# Patient Record
Sex: Female | Born: 1950 | Race: White | Hispanic: No | Marital: Married | Smoking: Former smoker
Health system: Southern US, Community
[De-identification: ages and names within clinical notes are randomized; demographics above are authoritative.]

## PROBLEM LIST (undated history)

## (undated) DIAGNOSIS — F32A Depression, unspecified: Secondary | ICD-10-CM

## (undated) DIAGNOSIS — G629 Polyneuropathy, unspecified: Secondary | ICD-10-CM

## (undated) DIAGNOSIS — Z972 Presence of dental prosthetic device (complete) (partial): Secondary | ICD-10-CM

## (undated) DIAGNOSIS — Z973 Presence of spectacles and contact lenses: Secondary | ICD-10-CM

## (undated) DIAGNOSIS — E538 Deficiency of other specified B group vitamins: Secondary | ICD-10-CM

## (undated) DIAGNOSIS — D649 Anemia, unspecified: Secondary | ICD-10-CM

## (undated) DIAGNOSIS — E039 Hypothyroidism, unspecified: Secondary | ICD-10-CM

## (undated) DIAGNOSIS — J302 Other seasonal allergic rhinitis: Secondary | ICD-10-CM

## (undated) DIAGNOSIS — F329 Major depressive disorder, single episode, unspecified: Secondary | ICD-10-CM

## (undated) HISTORY — PX: TONSILLECTOMY: SUR1361

## (undated) HISTORY — PX: TUBAL LIGATION: SHX77

---

## 2005-12-15 ENCOUNTER — Ambulatory Visit: Payer: Self-pay | Admitting: Family Medicine

## 2006-11-20 ENCOUNTER — Ambulatory Visit: Payer: Self-pay | Admitting: Emergency Medicine

## 2007-09-26 ENCOUNTER — Ambulatory Visit: Payer: Self-pay | Admitting: Family Medicine

## 2007-11-30 ENCOUNTER — Ambulatory Visit: Payer: Self-pay | Admitting: Unknown Physician Specialty

## 2008-10-04 ENCOUNTER — Ambulatory Visit: Payer: Self-pay | Admitting: Nurse Practitioner

## 2010-02-10 ENCOUNTER — Ambulatory Visit: Payer: Self-pay | Admitting: Family Medicine

## 2011-02-12 ENCOUNTER — Ambulatory Visit: Payer: Self-pay | Admitting: Family Medicine

## 2011-05-22 ENCOUNTER — Ambulatory Visit: Payer: Self-pay

## 2012-06-22 ENCOUNTER — Ambulatory Visit: Payer: Self-pay | Admitting: Ophthalmology

## 2012-06-22 HISTORY — PX: CATARACT EXTRACTION W/ INTRAOCULAR LENS IMPLANT: SHX1309

## 2015-04-01 ENCOUNTER — Encounter: Payer: Self-pay | Admitting: *Deleted

## 2015-04-04 NOTE — Discharge Instructions (Signed)

## 2015-04-08 ENCOUNTER — Ambulatory Visit: Admitting: Anesthesiology

## 2015-04-08 ENCOUNTER — Encounter
Admission: RE | Disposition: A | Discharge status: Home / Self Care | Payer: Self-pay | Source: Ambulatory Visit | Attending: Ophthalmology

## 2015-04-08 ENCOUNTER — Ambulatory Visit
Admission: RE | Admit: 2015-04-08 | Discharge: 2015-04-08 | Disposition: A | Discharge status: Home / Self Care | Source: Ambulatory Visit | Attending: Ophthalmology | Admitting: Ophthalmology

## 2015-04-08 DIAGNOSIS — D649 Anemia, unspecified: Secondary | ICD-10-CM | POA: Diagnosis not present

## 2015-04-08 DIAGNOSIS — Z9842 Cataract extraction status, left eye: Secondary | ICD-10-CM | POA: Diagnosis not present

## 2015-04-08 DIAGNOSIS — K5790 Diverticulosis of intestine, part unspecified, without perforation or abscess without bleeding: Secondary | ICD-10-CM | POA: Insufficient documentation

## 2015-04-08 DIAGNOSIS — Z87891 Personal history of nicotine dependence: Secondary | ICD-10-CM | POA: Diagnosis not present

## 2015-04-08 DIAGNOSIS — H2511 Age-related nuclear cataract, right eye: Secondary | ICD-10-CM | POA: Diagnosis present

## 2015-04-08 DIAGNOSIS — F329 Major depressive disorder, single episode, unspecified: Secondary | ICD-10-CM | POA: Insufficient documentation

## 2015-04-08 DIAGNOSIS — Z888 Allergy status to other drugs, medicaments and biological substances status: Secondary | ICD-10-CM | POA: Insufficient documentation

## 2015-04-08 DIAGNOSIS — E538 Deficiency of other specified B group vitamins: Secondary | ICD-10-CM | POA: Insufficient documentation

## 2015-04-08 DIAGNOSIS — E039 Hypothyroidism, unspecified: Secondary | ICD-10-CM | POA: Diagnosis not present

## 2015-04-08 DIAGNOSIS — G629 Polyneuropathy, unspecified: Secondary | ICD-10-CM | POA: Diagnosis not present

## 2015-04-08 HISTORY — DX: Presence of dental prosthetic device (complete) (partial): Z97.2

## 2015-04-08 HISTORY — DX: Anemia, unspecified: D64.9

## 2015-04-08 HISTORY — DX: Major depressive disorder, single episode, unspecified: F32.9

## 2015-04-08 HISTORY — DX: Deficiency of other specified B group vitamins: E53.8

## 2015-04-08 HISTORY — PX: CATARACT EXTRACTION W/PHACO: SHX586

## 2015-04-08 HISTORY — DX: Depression, unspecified: F32.A

## 2015-04-08 HISTORY — DX: Polyneuropathy, unspecified: G62.9

## 2015-04-08 HISTORY — DX: Other seasonal allergic rhinitis: J30.2

## 2015-04-08 HISTORY — DX: Presence of spectacles and contact lenses: Z97.3

## 2015-04-08 HISTORY — DX: Hypothyroidism, unspecified: E03.9

## 2015-04-08 SURGERY — PHACOEMULSIFICATION, CATARACT, WITH IOL INSERTION
Anesthesia: Monitor Anesthesia Care | Laterality: Right | Wound class: Clean

## 2015-04-08 MED ORDER — ARMC OPHTHALMIC DILATING GEL
1.0000 "application " | OPHTHALMIC | Status: DC | PRN
  Administered 2015-04-08 (×2): 1 via OPHTHALMIC

## 2015-04-08 MED ORDER — EPINEPHRINE HCL 1 MG/ML IJ SOLN
INTRAOCULAR | Status: DC | PRN
  Administered 2015-04-08: 117 mL via OPHTHALMIC

## 2015-04-08 MED ORDER — CEFUROXIME OPHTHALMIC INJECTION 1 MG/0.1 ML
INJECTION | OPHTHALMIC | Status: DC | PRN
  Administered 2015-04-08: 0.1 mL via INTRACAMERAL

## 2015-04-08 MED ORDER — LACTATED RINGERS IV SOLN: INTRAVENOUS | Status: DC

## 2015-04-08 MED ORDER — FENTANYL CITRATE (PF) 100 MCG/2ML IJ SOLN
INTRAMUSCULAR | Status: DC | PRN
  Administered 2015-04-08: 25 ug via INTRAVENOUS
  Administered 2015-04-08: 50 ug via INTRAVENOUS
  Administered 2015-04-08: 25 ug via INTRAVENOUS

## 2015-04-08 MED ORDER — TETRACAINE HCL 0.5 % OP SOLN
1.0000 [drp] | OPHTHALMIC | Status: DC | PRN
  Administered 2015-04-08: 1 [drp] via OPHTHALMIC

## 2015-04-08 MED ORDER — NA HYALUR & NA CHOND-NA HYALUR 0.4-0.35 ML IO KIT
PACK | INTRAOCULAR | Status: DC | PRN
  Administered 2015-04-08: 1 mL via INTRAOCULAR

## 2015-04-08 MED ORDER — POVIDONE-IODINE 5 % OP SOLN
1.0000 "application " | OPHTHALMIC | Status: DC | PRN
  Administered 2015-04-08: 1 via OPHTHALMIC

## 2015-04-08 MED ORDER — LIDOCAINE HCL (PF) 4 % IJ SOLN
INTRAOCULAR | Status: DC | PRN
  Administered 2015-04-08: 1 mL via OPHTHALMIC

## 2015-04-08 MED ORDER — TIMOLOL MALEATE 0.5 % OP SOLN
OPHTHALMIC | Status: DC | PRN
  Administered 2015-04-08: 1 [drp] via OPHTHALMIC

## 2015-04-08 MED ORDER — MIDAZOLAM HCL 2 MG/2ML IJ SOLN
INTRAMUSCULAR | Status: DC | PRN
Start: 2015-04-08 — End: 2015-04-08
  Administered 2015-04-08 (×2): 1 mg via INTRAVENOUS

## 2015-04-08 MED ORDER — BRIMONIDINE TARTRATE 0.2 % OP SOLN
OPHTHALMIC | Status: DC | PRN
  Administered 2015-04-08: 1 [drp] via OPHTHALMIC

## 2015-04-08 SURGICAL SUPPLY — 29 items
APPLICATOR COTTON TIP 3IN (MISCELLANEOUS) ×3 IMPLANT
CANNULA ANT/CHMB 27GA (MISCELLANEOUS) ×3 IMPLANT
DISSECTOR HYDRO NUCLEUS 50X22 (MISCELLANEOUS) ×3 IMPLANT
GLOVE BIO SURGEON STRL SZ7 (GLOVE) ×3 IMPLANT
GLOVE SURG LX 6.5 MICRO (GLOVE) ×2
GLOVE SURG LX STRL 6.5 MICRO (GLOVE) ×1 IMPLANT
GOWN STRL REUS W/ TWL LRG LVL3 (GOWN DISPOSABLE) ×2 IMPLANT
GOWN STRL REUS W/TWL LRG LVL3 (GOWN DISPOSABLE) ×4
LENS IOL ACRSF IQ PC 14.5 (Intraocular Lens) ×1 IMPLANT
LENS IOL ACRYSOF IQ POST 14.5 (Intraocular Lens) ×3 IMPLANT
MARKER SKIN SURG W/RULER VIO (MISCELLANEOUS) ×3 IMPLANT
NEEDLE FILTER BLUNT 18X 1/2SAF (NEEDLE) ×2
NEEDLE FILTER BLUNT 18X1 1/2 (NEEDLE) ×1 IMPLANT
PACK CATARACT BRASINGTON (MISCELLANEOUS) ×3 IMPLANT
PACK EYE AFTER SURG (MISCELLANEOUS) ×3 IMPLANT
PACK OPTHALMIC (MISCELLANEOUS) ×3 IMPLANT
RING MALYGIN 7.0 (MISCELLANEOUS) IMPLANT
SOL BAL SALT 15ML (MISCELLANEOUS)
SOLUTION BAL SALT 15ML (MISCELLANEOUS) IMPLANT
SUT ETHILON 10-0 CS-B-6CS-B-6 (SUTURE)
SUT VICRYL  9 0 (SUTURE)
SUT VICRYL 9 0 (SUTURE) IMPLANT
SUTURE EHLN 10-0 CS-B-6CS-B-6 (SUTURE) IMPLANT
SYR 3ML LL SCALE MARK (SYRINGE) ×3 IMPLANT
SYR TB 1ML LUER SLIP (SYRINGE) ×3 IMPLANT
WATER STERILE IRR 250ML POUR (IV SOLUTION) ×3 IMPLANT
WATER STERILE IRR 500ML POUR (IV SOLUTION) IMPLANT
WICK EYE OCUCEL (MISCELLANEOUS) IMPLANT
WIPE NON LINTING 3.25X3.25 (MISCELLANEOUS) ×3 IMPLANT

## 2015-04-08 NOTE — Anesthesia Postprocedure Evaluation (Signed)
Anesthesia Post Note  Patient: Warehouse managerBillie R Saddler  Procedure(s) Performed: Procedure(s) (LRB): CATARACT EXTRACTION PHACO AND INTRAOCULAR LENS PLACEMENT (IOC) (Right)  Patient location during evaluation: PACU Anesthesia Type: MAC Level of consciousness: awake and alert and oriented Pain management: satisfactory to patient Vital Signs Assessment: post-procedure vital signs reviewed and stable Respiratory status: spontaneous breathing, nonlabored ventilation and respiratory function stable Cardiovascular status: blood pressure returned to baseline and stable Postop Assessment: Adequate PO intake and No signs of nausea or vomiting Anesthetic complications: no    Last Vitals:  Filed Vitals:   04/08/15 1008 04/08/15 1133  BP: 132/76   Pulse: 60   Temp: 36.6 C 36.4 C  Resp: 16     Last Pain: There were no vitals filed for this visit.               Cherly BeachStella, Darian Ace J

## 2015-04-08 NOTE — H&P (Signed)
H+P reviewed and is up to date, please see paper chart.  

## 2015-04-08 NOTE — Anesthesia Preprocedure Evaluation (Signed)
Anesthesia Evaluation  Patient identified by MRN, date of birth, ID band  Reviewed: Allergy & Precautions, H&P , NPO status , Patient's Chart, lab work & pertinent test results  Airway Mallampati: II  TM Distance: >3 FB Neck ROM: full    Dental no notable dental hx.    Pulmonary former smoker,    Pulmonary exam normal        Cardiovascular  Rhythm:regular Rate:Normal     Neuro/Psych PSYCHIATRIC DISORDERS    GI/Hepatic   Endo/Other  Hypothyroidism   Renal/GU      Musculoskeletal   Abdominal   Peds  Hematology   Anesthesia Other Findings   Reproductive/Obstetrics                             Anesthesia Physical Anesthesia Plan  ASA: II  Anesthesia Plan: MAC   Post-op Pain Management:    Induction:   Airway Management Planned:   Additional Equipment:   Intra-op Plan:   Post-operative Plan:   Informed Consent: I have reviewed the patients History and Physical, chart, labs and discussed the procedure including the risks, benefits and alternatives for the proposed anesthesia with the patient or authorized representative who has indicated his/her understanding and acceptance.     Plan Discussed with: CRNA  Anesthesia Plan Comments:         Anesthesia Quick Evaluation

## 2015-04-08 NOTE — Transfer of Care (Signed)
Immediate Anesthesia Transfer of Care Note  Patient: Sophia Rivera  Procedure(s) Performed: Procedure(s): CATARACT EXTRACTION PHACO AND INTRAOCULAR LENS PLACEMENT (IOC) (Right)  Patient Location: PACU  Anesthesia Type: MAC  Level of Consciousness: awake, alert  and patient cooperative  Airway and Oxygen Therapy: Patient Spontanous Breathing and Patient connected to supplemental oxygen  Post-op Assessment: Post-op Vital signs reviewed, Patient's Cardiovascular Status Stable, Respiratory Function Stable, Patent Airway and No signs of Nausea or vomiting  Post-op Vital Signs: Reviewed and stable  Complications: No apparent anesthesia complications

## 2015-04-08 NOTE — Anesthesia Procedure Notes (Signed)
Procedure Name: MAC Performed by: Ahmani Prehn Pre-anesthesia Checklist: Patient identified, Emergency Drugs available, Suction available, Timeout performed and Patient being monitored Patient Re-evaluated:Patient Re-evaluated prior to inductionOxygen Delivery Method: Nasal cannula Placement Confirmation: positive ETCO2     

## 2015-04-08 NOTE — Op Note (Signed)
Date of Surgery: 04/08/2015  PREOPERATIVE DIAGNOSES: Visually significant nuclear sclerotic cataract, right eye.  POSTOPERATIVE DIAGNOSES: Same  PROCEDURES PERFORMED: Cataract extraction with intraocular lens implant, right eye.  SURGEON: Devin GoingAnita P. Vin, M.D.  ANESTHESIA: MAC and topical  IMPLANTS: AcrySof IQ [id number] +14.5 D   Implant Name Type Inv. Item Serial No. Manufacturer Lot No. LRB No. Used  IMPLANT LENS SN60WF 14.5 - [id number] Intraocular Lens IMPLANT LENS SN60WF 14.5 [id number] [employer]   Right 1     COMPLICATIONS: None.  DESCRIPTION OF PROCEDURE: Therapeutic options were discussed with the patient preoperatively, including a discussion of risks and benefits of surgery. Informed consent was obtained. An IOL-Master and immersion biometry were used to take the lens measurements, and a dilated fundus exam was performed within 6 months of the surgical date.  The patient was premedicated and brought to the operating room and placed on the operating table in the supine position. After adequate anesthesia, the patient was prepped and draped in the usual sterile ophthalmic fashion. A wire lid speculum was inserted and the microscope was positioned. A Superblade was used to create a paracentesis site at the limbus and a small amount of dilute preservative free lidocaine was instilled into the anterior chamber, followed by dispersive viscoelastic. A clear corneal incision was created temporally using a 2.4 mm keratome blade. Capsulorrhexis was then performed. In situ phacoemulsification was performed.  Cortical material was removed with the irrigation-aspiration unit. Dispersive viscoelastic was instilled to open the capsular bag. A posterior chamber intraocular lens with the specifications above was inserted and positioned. Irrigation-aspiration was used to remove all viscoelastic. Cefuroxime 1cc was instilled into the anterior chamber, and the corneal incision was checked and found to be  water tight. The eyelid speculum was removed.  The operative eye was covered with protective goggles after instilling 1 drop of timolol and brimonidine. The patient tolerated the procedure well. There were no complications.

## 2015-04-09 ENCOUNTER — Encounter: Payer: Self-pay | Admitting: Ophthalmology

## 2016-01-13 ENCOUNTER — Encounter: Payer: Self-pay | Admitting: *Deleted

## 2016-01-13 ENCOUNTER — Ambulatory Visit
Admission: EM | Admit: 2016-01-13 | Discharge: 2016-01-13 | Disposition: A | Discharge status: Home / Self Care | Attending: Family Medicine | Admitting: Family Medicine

## 2016-01-13 DIAGNOSIS — N390 Urinary tract infection, site not specified: Secondary | ICD-10-CM | POA: Diagnosis not present

## 2016-01-13 LAB — URINALYSIS COMPLETE WITH MICROSCOPIC (ARMC ONLY)
GLUCOSE, UA: NEGATIVE mg/dL
NITRITE: POSITIVE — AB
Protein, ur: 100 mg/dL — AB
SPECIFIC GRAVITY, URINE: 1.02 (ref 1.005–1.030)
Squamous Epithelial / LPF: NONE SEEN
pH: 7 (ref 5.0–8.0)

## 2016-01-13 MED ORDER — CIPROFLOXACIN HCL 500 MG PO TABS: 500.0000 mg | ORAL_TABLET | Freq: Two times a day (BID) | ORAL | 0 refills | Status: AC

## 2016-01-13 NOTE — ED Triage Notes (Signed)
Patient started having symptoms of burning and visible blood in urine yesterday PM. Patient states having a UTI 5 years ago.

## 2016-01-13 NOTE — ED Provider Notes (Signed)
[facility]    CSN: [id number] Arrival date & time: 01/13/16  0905  First Provider Contact:  None       History   Chief Complaint Chief Complaint  Patient presents with  . Urinary Frequency    HPI Sophia Rivera is a 65 y.o. female.   The history is provided by the patient.  Dysuria  Pain quality:  Burning Pain severity:  Moderate Onset quality:  Sudden Duration:  2 days Timing:  Constant Progression:  Worsening Chronicity:  New Recent urinary tract infections: no   Relieved by:  None tried Ineffective treatments:  None tried Urinary symptoms: frequent urination and hesitancy   Urinary symptoms: no discolored urine, no foul-smelling urine, no hematuria and no bladder incontinence   Associated symptoms: no abdominal pain, no fever, no flank pain, no genital lesions, no nausea, no vaginal discharge and no vomiting   Risk factors: no hx of pyelonephritis, no hx of urolithiasis, no kidney transplant, not pregnant, no recurrent urinary tract infections, no renal cysts, no renal disease, no sexually transmitted infections, no single kidney and no urinary catheter     Past Medical History:  Diagnosis Date  . Anemia   . Depression   . Hypothyroidism   . Neuropathy (HCC)    from vitamin b12 deficiency  . Seasonal allergies   . Vitamin B12 deficiency   . Wears contact lenses    right eye  . Wears dentures    partial upper    There are no active problems to display for this patient.   Past Surgical History:  Procedure Laterality Date  . CATARACT EXTRACTION W/ INTRAOCULAR LENS IMPLANT Left 06/22/2012   Dr. Inez Pilgrim, [facility]  . CATARACT EXTRACTION W/PHACO Right 04/08/2015   Procedure: CATARACT EXTRACTION PHACO AND INTRAOCULAR LENS PLACEMENT (IOC);  Surgeon: Sherald Hess, MD;  Location: [facility];  Service: Ophthalmology;  Laterality: Right;  . CESAREAN SECTION    . TONSILLECTOMY    . TUBAL LIGATION      OB History    No data  available       Home Medications    Prior to Admission medications   Medication Sig Start Date End Date Taking? Authorizing Provider  buPROPion (WELLBUTRIN XL) 150 MG 24 hr tablet Take 150 mg by mouth daily.   Yes Historical Provider, MD  Cyanocobalamin (VITAMIN B12 PO) Take by mouth.   Yes Historical Provider, MD  FIBER PO Take by mouth.   Yes Historical Provider, MD  levothyroxine (SYNTHROID, LEVOTHROID) 175 MCG tablet Take 175 mcg by mouth daily before breakfast.   Yes Historical Provider, MD  MAGNESIUM PO Take by mouth.   Yes Historical Provider, MD  naproxen sodium (ANAPROX) 220 MG tablet Take 220 mg by mouth as needed.   Yes Historical Provider, MD  venlafaxine (EFFEXOR) 75 MG tablet Take 75 mg by mouth 2 (two) times daily. 150 mg AM, 75 mg PM   Yes Historical Provider, MD  BIOTIN PO Take by mouth.    Historical Provider, MD  buPROPion (WELLBUTRIN) 75 MG tablet Take 75 mg by mouth daily. AM    Historical Provider, MD  ciprofloxacin (CIPRO) 500 MG tablet Take 1 tablet (500 mg total) by mouth every 12 (twelve) hours. 01/13/16   Payton Mccallum, MD  levothyroxine (SYNTHROID, LEVOTHROID) 150 MCG tablet Take 150 mcg by mouth daily before breakfast.    Historical Provider, MD    Family History History reviewed. No pertinent family history.  Social History Social  History  Substance Use Topics  . Smoking status: Former Smoker    Packs/day: 0.50    Years: 30.00  . Smokeless tobacco: Never Used     Comment: quit approx 1999  . Alcohol use No     Allergies   Prednisone   Review of Systems Review of Systems  Constitutional: Negative for fever.  Gastrointestinal: Negative for abdominal pain, nausea and vomiting.  Genitourinary: Positive for dysuria. Negative for flank pain and vaginal discharge.     Physical Exam Triage Vital Signs ED Triage Vitals  Enc Vitals Group     BP 01/13/16 0924 122/73     Pulse Rate 01/13/16 0924 81     Resp 01/13/16 0924 18     Temp 01/13/16  0924 99.1 F (37.3 C)     Temp Source 01/13/16 0924 Oral     SpO2 01/13/16 0924 98 %     Weight 01/13/16 0927 175 lb (79.4 kg)     Height 01/13/16 0927 5\' 7"  (1.702 m)     Head Circumference --      Peak Flow --      Pain Score 01/13/16 0933 2     Pain Loc --      Pain Edu? --      Excl. in GC? --    No data found.   Updated Vital Signs BP 122/73 (BP Location: Right Arm)   Pulse 81   Temp 99.1 F (37.3 C) (Oral)   Resp 18   Ht 5\' 7"  (1.702 m)   Wt 175 lb (79.4 kg)   SpO2 98%   BMI 27.41 kg/m   Visual Acuity Right Eye Distance:   Left Eye Distance:   Bilateral Distance:    Right Eye Near:   Left Eye Near:    Bilateral Near:     Physical Exam  Constitutional: She appears well-developed and well-nourished. No distress.  Abdominal: Soft. Bowel sounds are normal. She exhibits no distension and no mass. There is tenderness (mild suprapubic). There is no rebound and no guarding.  Skin: She is not diaphoretic.  Nursing note and vitals reviewed.    UC Treatments / Results  Labs (all labs ordered are listed, but only abnormal results are displayed) Labs Reviewed  URINALYSIS COMPLETEWITH MICROSCOPIC ([facility] ONLY) - Abnormal; Notable for the following:       Result Value   Color, Urine AMBER (*)    APPearance CLOUDY (*)    Bilirubin Urine SMALL (*)    Ketones, ur TRACE (*)    Hgb urine dipstick LARGE (*)    Protein, ur 100 (*)    Nitrite POSITIVE (*)    Leukocytes, UA MODERATE (*)    Bacteria, UA MANY (*)    All other components within normal limits    EKG  EKG Interpretation None       Radiology No results found.  Procedures Procedures (including critical care time)  Medications Ordered in UC Medications - No data to display   Initial Impression / Assessment and Plan / UC Course  I have reviewed the triage vital signs and the nursing notes.  Pertinent labs & imaging results that were available during my care of the patient were reviewed by me and  considered in my medical decision making (see chart for details).  Clinical Course      Final Clinical Impressions(s) / UC Diagnoses   Final diagnoses:  UTI (lower urinary tract infection)    New Prescriptions Discharge Medication List as  of 01/13/2016 10:20 AM    START taking these medications   Details  ciprofloxacin (CIPRO) 500 MG tablet Take 1 tablet (500 mg total) by mouth every 12 (twelve) hours., Starting Mon 01/13/2016, Normal       1. Lab results and diagnosis reviewed with patient 2. rx as per orders above; reviewed possible side effects, interactions, risks and benefits  3. Recommend supportive treatment with increased water intake 4. Follow-up prn if symptoms worsen or don't improve    Payton Mccallumrlando Leticia Coletta, MD 01/13/16 1102

## 2017-06-09 ENCOUNTER — Other Ambulatory Visit: Payer: Self-pay | Admitting: Family Medicine

## 2017-06-09 DIAGNOSIS — Z1231 Encounter for screening mammogram for malignant neoplasm of breast: Secondary | ICD-10-CM

## 2017-06-15 ENCOUNTER — Encounter (INDEPENDENT_AMBULATORY_CARE_PROVIDER_SITE_OTHER): Payer: Self-pay

## 2017-06-15 ENCOUNTER — Ambulatory Visit
Admission: RE | Admit: 2017-06-15 | Discharge: 2017-06-15 | Disposition: A | Discharge status: Home / Self Care | Payer: Medicare Other | Source: Ambulatory Visit | Attending: Family Medicine | Admitting: Family Medicine

## 2017-06-15 DIAGNOSIS — Z1231 Encounter for screening mammogram for malignant neoplasm of breast: Secondary | ICD-10-CM | POA: Insufficient documentation

## 2017-08-02 ENCOUNTER — Other Ambulatory Visit: Payer: Self-pay | Admitting: Family Medicine

## 2017-08-02 DIAGNOSIS — Z780 Asymptomatic menopausal state: Secondary | ICD-10-CM

## 2018-06-08 ENCOUNTER — Other Ambulatory Visit: Payer: Self-pay | Admitting: Family Medicine

## 2018-06-08 DIAGNOSIS — Z1231 Encounter for screening mammogram for malignant neoplasm of breast: Secondary | ICD-10-CM

## 2018-06-27 ENCOUNTER — Ambulatory Visit
Admission: RE | Admit: 2018-06-27 | Discharge: 2018-06-27 | Disposition: A | Discharge status: Home / Self Care | Payer: Medicare Other | Source: Ambulatory Visit | Attending: Family Medicine | Admitting: Family Medicine

## 2018-06-27 ENCOUNTER — Encounter (INDEPENDENT_AMBULATORY_CARE_PROVIDER_SITE_OTHER): Payer: Self-pay

## 2018-06-27 DIAGNOSIS — Z1231 Encounter for screening mammogram for malignant neoplasm of breast: Secondary | ICD-10-CM | POA: Diagnosis present

## 2018-06-27 DIAGNOSIS — Z780 Asymptomatic menopausal state: Secondary | ICD-10-CM | POA: Diagnosis present

## 2018-06-29 ENCOUNTER — Other Ambulatory Visit: Payer: Self-pay | Admitting: Family Medicine

## 2018-06-29 DIAGNOSIS — R928 Other abnormal and inconclusive findings on diagnostic imaging of breast: Secondary | ICD-10-CM

## 2018-07-05 ENCOUNTER — Ambulatory Visit
Admission: RE | Admit: 2018-07-05 | Discharge: 2018-07-05 | Disposition: A | Discharge status: Home / Self Care | Payer: Medicare Other | Source: Ambulatory Visit | Attending: Family Medicine | Admitting: Family Medicine

## 2018-07-05 DIAGNOSIS — R928 Other abnormal and inconclusive findings on diagnostic imaging of breast: Secondary | ICD-10-CM

## 2019-10-09 DIAGNOSIS — I10 Essential (primary) hypertension: Secondary | ICD-10-CM | POA: Insufficient documentation

## 2020-02-12 ENCOUNTER — Other Ambulatory Visit: Payer: Medicare Other

## 2020-02-12 DIAGNOSIS — Z20822 Contact with and (suspected) exposure to covid-19: Secondary | ICD-10-CM

## 2020-02-14 LAB — SARS-COV-2, NAA 2 DAY TAT

## 2020-02-14 LAB — NOVEL CORONAVIRUS, NAA: SARS-CoV-2, NAA: DETECTED — AB

## 2020-02-15 ENCOUNTER — Telehealth: Payer: Self-pay | Admitting: Nurse Practitioner

## 2020-02-15 ENCOUNTER — Encounter: Payer: Self-pay | Admitting: Nurse Practitioner

## 2020-02-15 DIAGNOSIS — U071 COVID-19: Secondary | ICD-10-CM

## 2020-02-15 NOTE — Telephone Encounter (Signed)
Called to Discuss with patient about Covid symptoms and the use of regeneron, a monoclonal antibody infusion for those with mild to moderate Covid symptoms and at a high risk of hospitalization.  Based on social vulnerability.    Pt is qualified for this infusion at the [facility] center due to co-morbid conditions and/or a member of an at-risk group.     Unable to reach pt. Left message to return call. Sent [employer]. Sent Text.   Consuello Masse, DNP, AGNP-C [phone] (Infusion Center Hotline)

## 2020-09-23 IMAGING — MG DIGITAL SCREENING BILATERAL MAMMOGRAM WITH TOMO AND CAD
8 series · 8 of 24 positions shown · non-contrast
Comparison: Previous exam(s).

CLINICAL DATA: Screening.

EXAM:
DIGITAL SCREENING BILATERAL MAMMOGRAM WITH TOMO AND CAD

[L CC synth-2D]
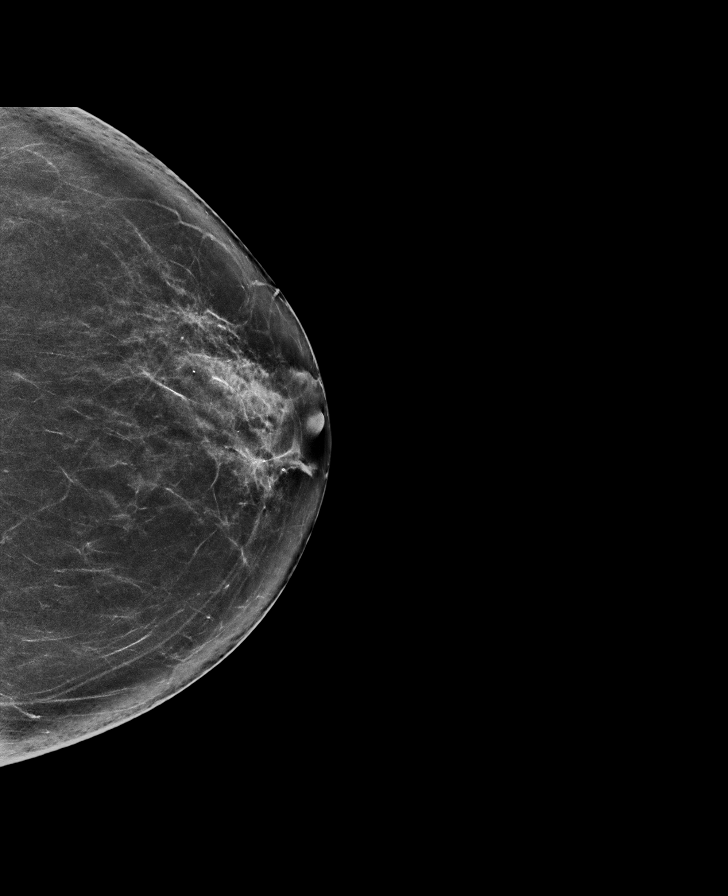

[R MLO synth-2D]
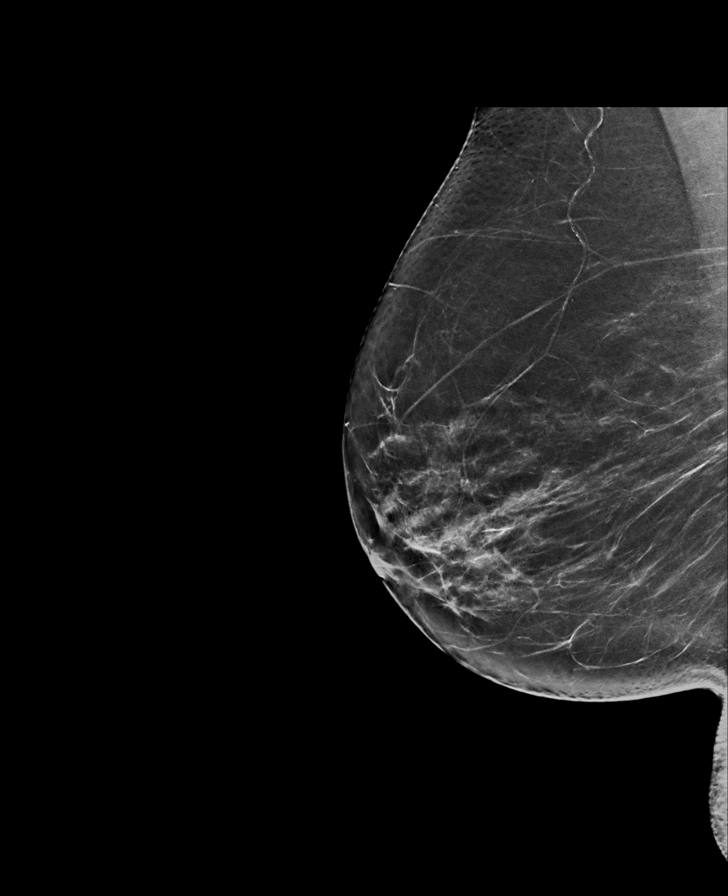

[R CC synth-2D]
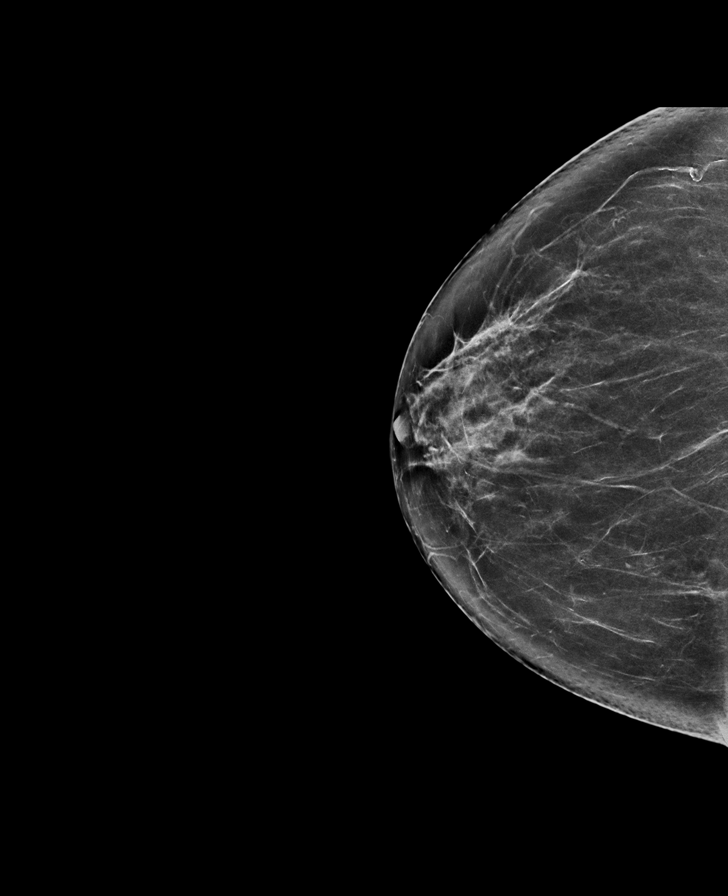

[L MLO synth-2D]
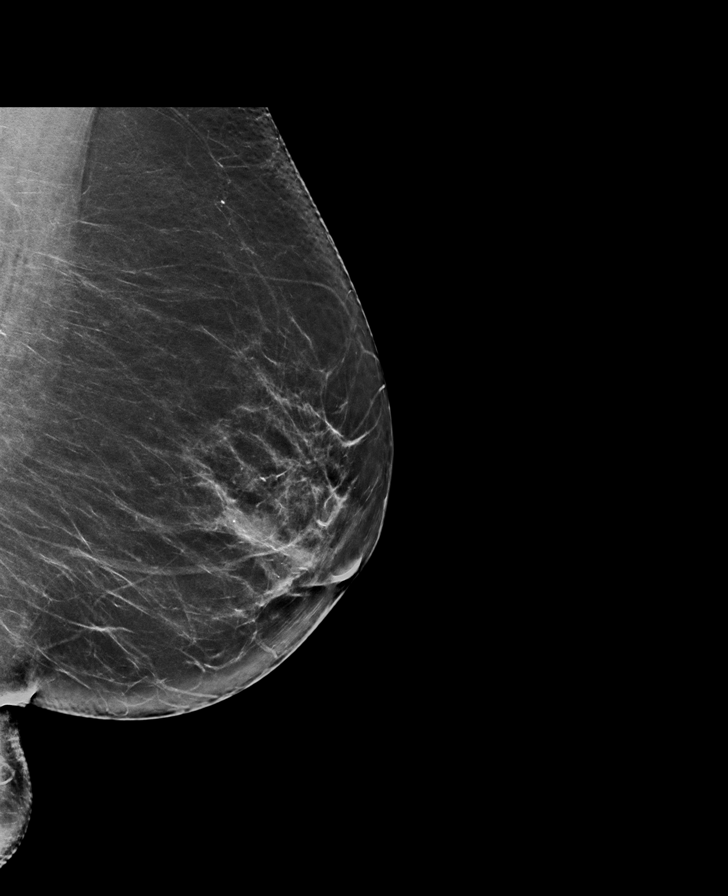

[L MLO tomo · tomo slice 39/78.0]
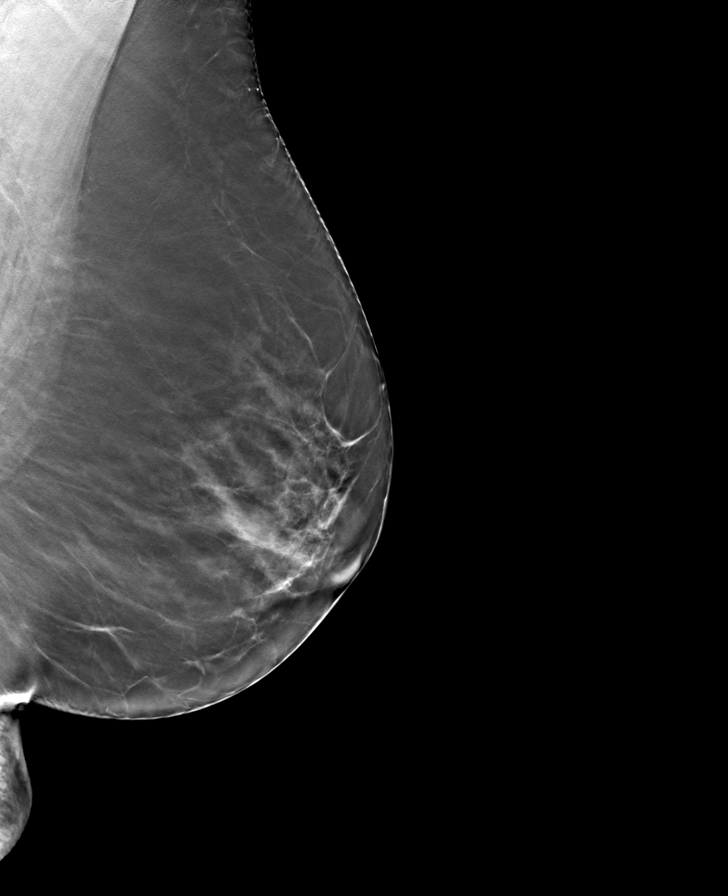

[R MLO tomo · tomo slice 37/73.0]
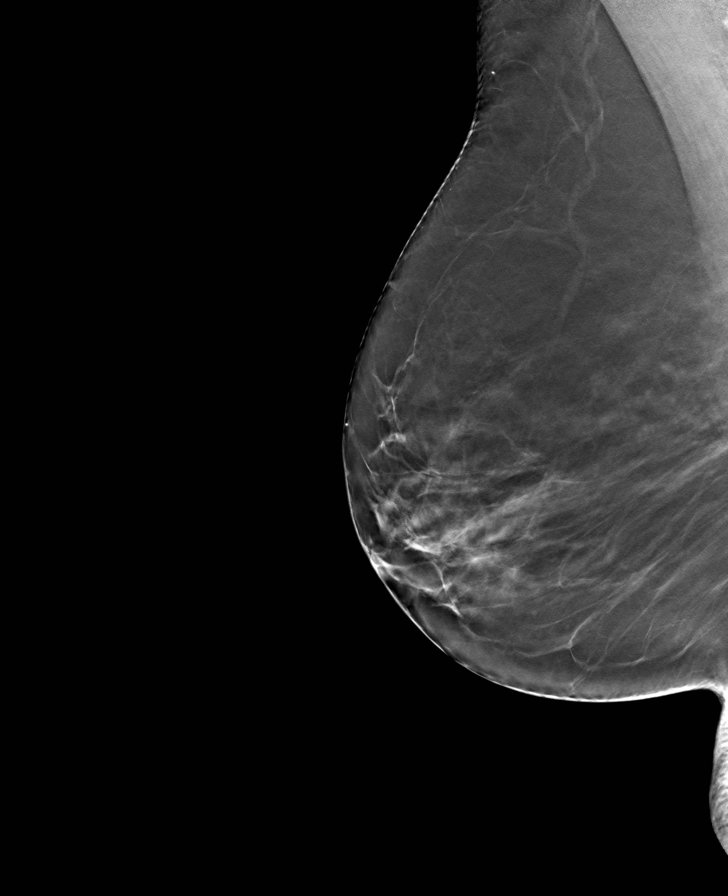

[R CC tomo · tomo slice 37/74.0]
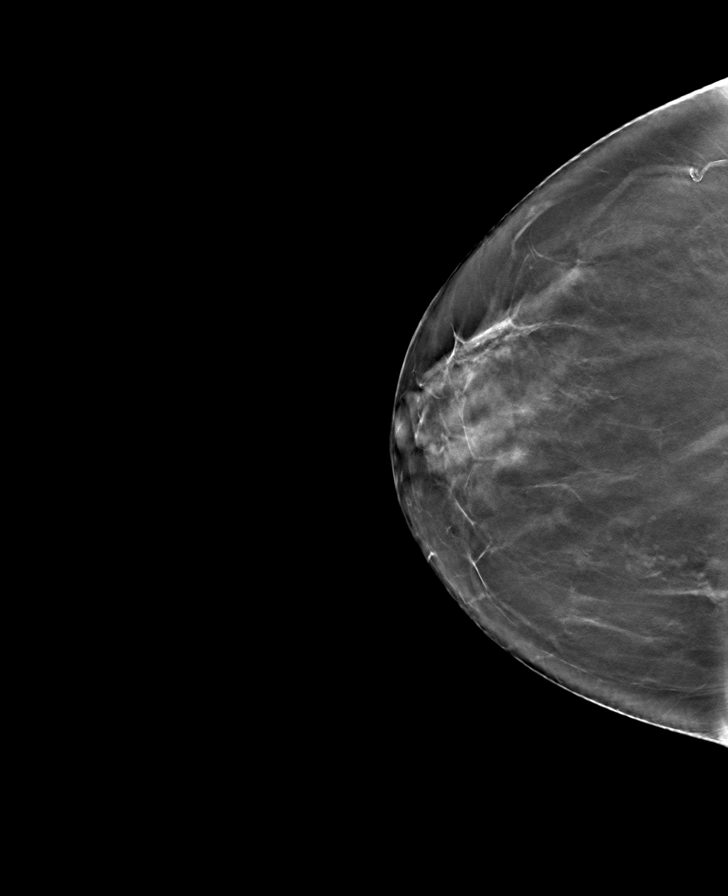

[L CC tomo · tomo slice 37/73.0]
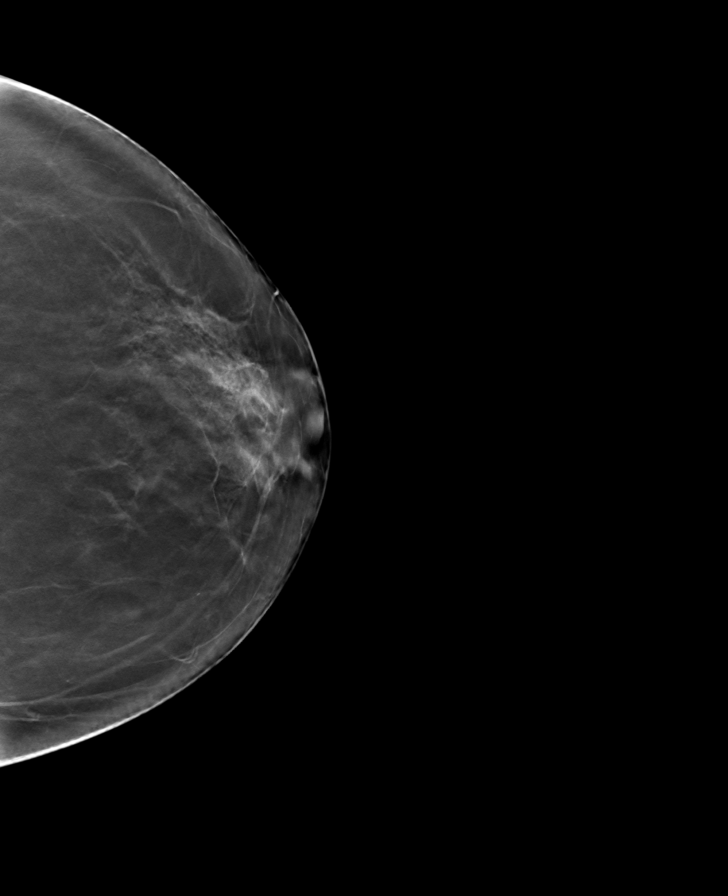

[8 of 24 positions shown; findings below may reference images not displayed]

ACR Breast Density Category b: There are scattered areas of
fibroglandular density.
FINDINGS: In the right breast, possible distortion warrants further
evaluation. In the left breast, no findings suspicious for
malignancy. Images were processed with CAD.
IMPRESSION: Further evaluation is suggested for possible distortion in the right
breast.

RECOMMENDATION:
Diagnostic mammogram and possibly ultrasound of the right breast.
(Code:B2-Z-66E)

The patient will be contacted regarding the findings, and additional
imaging will be scheduled.

BI-RADS CATEGORY  0: Incomplete. Need additional imaging evaluation
and/or prior mammograms for comparison.

## 2020-10-10 ENCOUNTER — Other Ambulatory Visit: Payer: Self-pay | Admitting: Family Medicine

## 2020-10-10 DIAGNOSIS — Z1231 Encounter for screening mammogram for malignant neoplasm of breast: Secondary | ICD-10-CM

## 2020-10-11 ENCOUNTER — Other Ambulatory Visit: Payer: Self-pay

## 2020-10-11 ENCOUNTER — Ambulatory Visit
Admission: RE | Admit: 2020-10-11 | Discharge: 2020-10-11 | Disposition: A | Discharge status: Home / Self Care | Payer: Medicare Other | Source: Ambulatory Visit | Attending: Family Medicine | Admitting: Family Medicine

## 2020-10-11 DIAGNOSIS — Z1231 Encounter for screening mammogram for malignant neoplasm of breast: Secondary | ICD-10-CM | POA: Diagnosis not present

## 2021-07-28 DIAGNOSIS — E538 Deficiency of other specified B group vitamins: Secondary | ICD-10-CM | POA: Insufficient documentation

## 2021-07-28 DIAGNOSIS — G629 Polyneuropathy, unspecified: Secondary | ICD-10-CM | POA: Insufficient documentation

## 2022-02-19 DIAGNOSIS — R7303 Prediabetes: Secondary | ICD-10-CM | POA: Insufficient documentation

## 2023-03-23 ENCOUNTER — Other Ambulatory Visit: Payer: Self-pay | Admitting: Family Medicine

## 2023-03-23 DIAGNOSIS — Z1231 Encounter for screening mammogram for malignant neoplasm of breast: Secondary | ICD-10-CM

## 2023-08-10 ENCOUNTER — Other Ambulatory Visit: Payer: Self-pay | Admitting: Family Medicine

## 2023-08-10 DIAGNOSIS — E785 Hyperlipidemia, unspecified: Secondary | ICD-10-CM

## 2023-08-10 DIAGNOSIS — Z780 Asymptomatic menopausal state: Secondary | ICD-10-CM

## 2023-08-10 DIAGNOSIS — Z1231 Encounter for screening mammogram for malignant neoplasm of breast: Secondary | ICD-10-CM

## 2023-08-19 ENCOUNTER — Ambulatory Visit
Admission: RE | Admit: 2023-08-19 | Discharge: 2023-08-19 | Disposition: A | Discharge status: Home / Self Care | Source: Ambulatory Visit | Attending: Family Medicine | Admitting: Family Medicine

## 2023-08-19 DIAGNOSIS — E785 Hyperlipidemia, unspecified: Secondary | ICD-10-CM

## 2023-08-19 DIAGNOSIS — Z1231 Encounter for screening mammogram for malignant neoplasm of breast: Secondary | ICD-10-CM | POA: Diagnosis present

## 2023-08-19 DIAGNOSIS — Z780 Asymptomatic menopausal state: Secondary | ICD-10-CM

## 2023-10-05 ENCOUNTER — Encounter (INDEPENDENT_AMBULATORY_CARE_PROVIDER_SITE_OTHER): Payer: Self-pay

## 2023-10-06 ENCOUNTER — Other Ambulatory Visit (INDEPENDENT_AMBULATORY_CARE_PROVIDER_SITE_OTHER): Payer: Self-pay | Admitting: Nurse Practitioner

## 2023-10-06 DIAGNOSIS — L819 Disorder of pigmentation, unspecified: Secondary | ICD-10-CM

## 2023-10-06 DIAGNOSIS — I7300 Raynaud's syndrome without gangrene: Secondary | ICD-10-CM

## 2023-10-08 ENCOUNTER — Ambulatory Visit (INDEPENDENT_AMBULATORY_CARE_PROVIDER_SITE_OTHER): Payer: Self-pay | Admitting: Nurse Practitioner

## 2023-10-08 ENCOUNTER — Ambulatory Visit (INDEPENDENT_AMBULATORY_CARE_PROVIDER_SITE_OTHER): Payer: Self-pay

## 2023-10-08 VITALS — BP 137/75 | HR 66 | Ht 67.0 in | Wt 155.0 lb

## 2023-10-08 DIAGNOSIS — I7301 Raynaud's syndrome with gangrene: Secondary | ICD-10-CM

## 2023-10-08 DIAGNOSIS — L819 Disorder of pigmentation, unspecified: Secondary | ICD-10-CM | POA: Diagnosis not present

## 2023-10-08 DIAGNOSIS — I7300 Raynaud's syndrome without gangrene: Secondary | ICD-10-CM | POA: Diagnosis not present

## 2023-10-08 DIAGNOSIS — G629 Polyneuropathy, unspecified: Secondary | ICD-10-CM | POA: Diagnosis not present

## 2023-10-08 DIAGNOSIS — I10 Essential (primary) hypertension: Secondary | ICD-10-CM

## 2023-10-11 ENCOUNTER — Encounter (INDEPENDENT_AMBULATORY_CARE_PROVIDER_SITE_OTHER): Payer: Self-pay | Admitting: Nurse Practitioner

## 2023-10-11 DIAGNOSIS — E785 Hyperlipidemia, unspecified: Secondary | ICD-10-CM | POA: Insufficient documentation

## 2023-10-11 NOTE — Progress Notes (Signed)
 Subjective:    Patient ID: Sophia Rivera, female    DOB: Dec 27, 1950, 73 y.o.   MRN: [id number] Chief Complaint  Patient presents with   Establish Care    The patient is seen for the evaluation of painful fingers and toes associated with Raynaud's changes. The patient notes the fingers and toes turned pale and then blue and become uncomfortable.  This happened during the wintertime however now she is not having any of these issues.  She notes that when it was happening she would not wear warm or close and socks and this would rectify the issues.  She currently denies any open wounds or issues.  No gangrenous changes.   The patient has not been taking Norvasc  There is no history of malignancy or autoimmune disease.  No recent shortening of the patient's walking distance or new symptoms consistent with claudication.  No history of rest pain symptoms. No new ulcers or wounds of the lower extremities have occurred.  The patient denies amaurosis fugax or recent TIA symptoms. There are no recent neurological changes noted. There is no history of DVT, PE or superficial thrombophlebitis. No recent episodes of angina or shortness of breath documented.    Today the patient has ABI of 1.07 on the right and 1.11 on the left.  She has strong triphasic waveforms however additional digit waveforms show signs consistent with Raynaud's disease    Review of Systems  Skin:  Positive for color change. Negative for wound.  All other systems reviewed and are negative.      Objective:    Physical Exam Vitals reviewed.  HENT:     Head: Normocephalic.  Cardiovascular:     Rate and Rhythm: Normal rate.     Pulses: Normal pulses.  Pulmonary:     Effort: Pulmonary effort is normal.  Skin:    General: Skin is warm and dry.  Neurological:     Mental Status: She is alert and oriented to person, place, and time.  Psychiatric:        Mood and Affect: Mood normal.        Behavior: Behavior normal.         Thought Content: Thought content normal.        Judgment: Judgment normal.     BP 137/75 (BP Location: Right Arm, Patient Position: Sitting, Cuff Size: Normal)   Pulse 66   Ht 5\' 7"  (1.702 m)   Wt 155 lb (70.3 kg)   BMI 24.28 kg/m   Past Medical History:  Diagnosis Date   Anemia    Depression    Hypothyroidism    Neuropathy    from vitamin b12 deficiency   Seasonal allergies    Vitamin B12 deficiency    Wears contact lenses    right eye   Wears dentures    partial upper    Social History   Socioeconomic History   Marital status: Married    Spouse name: Not on file   Number of children: Not on file   Years of education: Not on file   Highest education level: Not on file  Occupational History   Not on file  Tobacco Use   Smoking status: Former    Current packs/day: 0.50    Average packs/day: 0.5 packs/day for 30.0 years (15.0 ttl pk-yrs)    Types: Cigarettes   Smokeless tobacco: Never   Tobacco comments:    quit approx 1999  Substance and Sexual Activity   Alcohol use:  No   Drug use: No   Sexual activity: Not on file  Other Topics Concern   Not on file  Social History Narrative   Not on file   Social Drivers of Health   Financial Resource Strain: Low Risk  (08/18/2023)   Received from [facility]   Overall Financial Resource Strain (CARDIA)    Difficulty of Paying Living Expenses: Not hard at all  Food Insecurity: No Food Insecurity (08/18/2023)   Received from [facility]   Hunger Vital Sign    Worried About Running Out of Food in the Last Year: Never true    Ran Out of Food in the Last Year: Never true  Transportation Needs: No Transportation Needs (08/18/2023)   Received from [facility] - Transportation    In the past 12 months, has lack of transportation kept you from medical appointments or from getting medications?: No    Lack of Transportation (Non-Medical): No  Physical  Activity: Not on file  Stress: Not on file  Social Connections: Not on file  Intimate Partner Violence: Not on file    Past Surgical History:  Procedure Laterality Date   CATARACT EXTRACTION W/ INTRAOCULAR LENS IMPLANT Left 06/22/2012   Dr. Ignatius Makos, [facility]   CATARACT EXTRACTION W/PHACO Right 04/08/2015   Procedure: CATARACT EXTRACTION PHACO AND INTRAOCULAR LENS PLACEMENT (IOC);  Surgeon: Billee Buddle, MD;  Location: [facility];  Service: Ophthalmology;  Laterality: Right;   CESAREAN SECTION     TONSILLECTOMY     TUBAL LIGATION      Family History  Problem Relation Age of Onset   Breast cancer Cousin    Breast cancer Other 28    Allergies  Allergen Reactions   Prednisone Other (See Comments)    Prednisone eye drops caused elevated eye pressure        No data to display             CMP  No results found for: "NA", "K", "CL", "CO2", "GLUCOSE", "BUN", "CREATININE", "CALCIUM", "PROT", "ALBUMIN", "AST", "ALT", "ALKPHOS", "BILITOT", "GFR", "EGFR", "GFRNONAA"   No results found.     Assessment & Plan:   1. Raynaud's disease with gangrene (HCC) (Primary) Recommend:  The patient is currently tolerating the Raynaud's changes fairly well. Lengthy discussion regarding keeping the feet and the hands warm; gloves, washing with only warm water (especially avoiding cold water immersion) and using wool socks, specifically Smartwool was recommended.  Possibility of using Norvasc was discussed but it was decided to hold off for now until the benefits of conservative therapy is assessed.  The patient will follow up PRN if the changes worsen or persist.        2. Benign essential HTN Continue antihypertensive medications as already ordered, these medications have been reviewed and there are no changes at this time.  3. Neuropathy Neuropathy can periodically worsen with Raynaud's disease.  Patient will continue with treatment as advised by  PCP.   Current Outpatient Medications on File Prior to Visit  Medication Sig Dispense Refill   Cyanocobalamin (VITAMIN B12 PO) Take by mouth.     enalapril (VASOTEC) 10 MG tablet Take 10 mg by mouth.     levothyroxine (SYNTHROID, LEVOTHROID) 150 MCG tablet Take 150 mcg by mouth daily before breakfast.     MAGNESIUM PO Take by mouth.     Multiple Vitamins-Minerals (PRESERVISION AREDS 2) CAPS Take 1 capsule by mouth.     rosuvastatin (  CRESTOR) 20 MG tablet Take 20 mg by mouth.     venlafaxine (EFFEXOR) 75 MG tablet Take 75 mg by mouth 2 (two) times daily. 150 mg AM, 75 mg PM     BIOTIN PO Take by mouth. (Patient not taking: Reported on 10/08/2023)     buPROPion (WELLBUTRIN XL) 150 MG 24 hr tablet Take 150 mg by mouth daily. (Patient not taking: Reported on 10/08/2023)     buPROPion (WELLBUTRIN) 75 MG tablet Take 75 mg by mouth daily. AM (Patient not taking: Reported on 10/08/2023)     ciprofloxacin  (CIPRO ) 500 MG tablet Take 1 tablet (500 mg total) by mouth every 12 (twelve) hours. (Patient not taking: Reported on 10/08/2023) 10 tablet 0   FIBER PO Take by mouth. (Patient not taking: Reported on 10/08/2023)     levothyroxine (SYNTHROID, LEVOTHROID) 175 MCG tablet Take 175 mcg by mouth daily before breakfast. (Patient not taking: Reported on 10/08/2023)     naproxen sodium (ANAPROX) 220 MG tablet Take 220 mg by mouth as needed. (Patient not taking: Reported on 10/08/2023)     No current facility-administered medications on file prior to visit.    There are no Patient Instructions on file for this visit. No follow-ups on file.   Robel Wuertz E Donye Dauenhauer, NP
